# Patient Record
Sex: Female | Born: 1985 | Race: White | Hispanic: No | Marital: Single | Smoking: Current every day smoker
Health system: Southern US, Community
[De-identification: ages and names within clinical notes are randomized; demographics above are authoritative.]

## PROBLEM LIST (undated history)

## (undated) DIAGNOSIS — J939 Pneumothorax, unspecified: Secondary | ICD-10-CM

## (undated) HISTORY — PX: TUBAL LIGATION: SHX77

## (undated) HISTORY — PX: CHOLECYSTECTOMY: SHX55

---

## 2019-10-17 ENCOUNTER — Other Ambulatory Visit: Payer: Self-pay

## 2019-10-17 ENCOUNTER — Encounter (HOSPITAL_BASED_OUTPATIENT_CLINIC_OR_DEPARTMENT_OTHER): Payer: Self-pay | Admitting: Emergency Medicine

## 2019-10-17 ENCOUNTER — Emergency Department (HOSPITAL_BASED_OUTPATIENT_CLINIC_OR_DEPARTMENT_OTHER)
Admission: EM | Admit: 2019-10-17 | Discharge: 2019-10-17 | Disposition: A | Discharge status: Home / Self Care | Payer: Self-pay | Attending: Emergency Medicine | Admitting: Emergency Medicine

## 2019-10-17 DIAGNOSIS — N760 Acute vaginitis: Secondary | ICD-10-CM | POA: Insufficient documentation

## 2019-10-17 DIAGNOSIS — B9689 Other specified bacterial agents as the cause of diseases classified elsewhere: Secondary | ICD-10-CM | POA: Insufficient documentation

## 2019-10-17 DIAGNOSIS — F17210 Nicotine dependence, cigarettes, uncomplicated: Secondary | ICD-10-CM | POA: Insufficient documentation

## 2019-10-17 DIAGNOSIS — N939 Abnormal uterine and vaginal bleeding, unspecified: Secondary | ICD-10-CM | POA: Insufficient documentation

## 2019-10-17 HISTORY — DX: Pneumothorax, unspecified: J93.9

## 2019-10-17 LAB — URINALYSIS, MICROSCOPIC (REFLEX)

## 2019-10-17 LAB — URINALYSIS, ROUTINE W REFLEX MICROSCOPIC
Bilirubin Urine: NEGATIVE
Glucose, UA: NEGATIVE mg/dL
Ketones, ur: NEGATIVE mg/dL
Leukocytes,Ua: NEGATIVE
Nitrite: NEGATIVE
Protein, ur: NEGATIVE mg/dL
Specific Gravity, Urine: 1.01 (ref 1.005–1.030)
pH: 6.5 (ref 5.0–8.0)

## 2019-10-17 LAB — WET PREP, GENITAL
Sperm: NONE SEEN
Trich, Wet Prep: NONE SEEN
Yeast Wet Prep HPF POC: NONE SEEN

## 2019-10-17 MED ORDER — METRONIDAZOLE 500 MG PO TABS: 500.0000 mg | ORAL_TABLET | Freq: Two times a day (BID) | ORAL | 0 refills | Status: DC

## 2019-10-17 NOTE — Discharge Instructions (Addendum)
Follow-up with OB/GYN for further evaluation management of her vaginal bleeding. Take Flagyl as prescribed.  Take entire course, even if your symptoms improve. Do not drink alcohol while taking Flagyl as this can affect your liver. You have tests that are pending for cervical infections such as gonorrhea and chlamydia.  If positive, you will receive a phone call and will need treatment.  If negative, you will not receive a phone call.  Either way, you may check online on MyChart. Return to the emergency room if you develop high fevers, persistent nausea/vomiting, severe abdominal pain, or any new, worsening, or concerning symptoms.

## 2019-10-17 NOTE — ED Provider Notes (Signed)
[facility] EMERGENCY DEPARTMENT Provider Note   CSN: [id number] Arrival date & time: 10/17/19  1010     History   Chief Complaint Chief Complaint  Patient presents with  . Hematuria    HPI Abigail Mendoza is a 33 y.o. female presenting for evaluation of hematuria.   Patient states that the past week, she has noticed blood every time she urinates.  She is also noticing blood in her underwear in between urination.  She had this evaluated at [facility] last week, but no diagnosis was made.  Patient does not think this is coming from her vaginal canal, although has not had this evaluated, no pelvic was performed.  She denies fevers, chills, nausea, vomiting, abdominal pain, back pain.  She denies dysuria or urinary frequency.  She denies vaginal discharge.  She is sexually active with one female partner who is symptom-free.  They do not use condoms with protection.  She has had her tubes tied. Her LMP was 10/01.  Patient states initially she noted the blood was bright red, but now it is dark red.  There are no clots.  Patient states she has a history of fibroids, was on medication for this but has not been for the past 4 years due to loss of insurance.  She has not taken anything for her symptoms.  Patient states she takes amitriptyline at night for sleep, has no other medical problems and takes no other medications daily.     HPI  Past Medical History:  Diagnosis Date  . Pneumothorax     There are no active problems to display for this patient.   OB History   No obstetric history on file.      Home Medications    Prior to Admission medications   Medication Sig Start Date End Date Taking? Authorizing Provider  metroNIDAZOLE (FLAGYL) 500 MG tablet Take 1 tablet (500 mg total) by mouth 2 (two) times daily. 10/17/19   Chardae Mulkern, PA-C    Family History No family history on file.  Social History Social History   Tobacco Use  . Smoking status: Current  Every Day Smoker  . Smokeless tobacco: Never Used  Substance Use Topics  . Alcohol use: Not Currently  . Drug use: Never     Allergies   Penicillins   Review of Systems Review of Systems  Constitutional: Negative for fever.  Gastrointestinal: Negative for abdominal pain, nausea and vomiting.  Genitourinary:       Vaginal/urianry bleeding     Physical Exam Updated Vital Signs BP (!) 142/78 (BP Location: Right Arm)   Pulse 92   Temp 98.4 F (36.9 C) (Oral)   Resp 18   Ht 5\' 6"  (1.676 m)   Wt 63.5 kg   LMP 09/23/2019   SpO2 100%   BMI 22.60 kg/m   Physical Exam Vitals signs and nursing note reviewed. Exam conducted with a chaperone present.  Constitutional:      General: She is not in acute distress.    Appearance: She is well-developed.     Comments: Resting comfortably in the bed, no acute distress  HENT:     Head: Normocephalic and atraumatic.  Eyes:     Extraocular Movements: Extraocular movements intact.     Conjunctiva/sclera: Conjunctivae normal.     Pupils: Pupils are equal, round, and reactive to light.  Neck:     Musculoskeletal: Normal range of motion and neck supple.  Cardiovascular:     Rate and  Rhythm: Normal rate and regular rhythm.     Pulses: Normal pulses.  Pulmonary:     Effort: Pulmonary effort is normal. No respiratory distress.     Breath sounds: Normal breath sounds. No wheezing.  Abdominal:     General: There is no distension.     Palpations: Abdomen is soft. There is no mass.     Tenderness: There is no abdominal tenderness. There is no guarding or rebound.     Comments: No tenderness palpation the abdomen.  Soft without rigidity, guarding, distention.  Negative rebound.  No CVA tenderness.  Genitourinary:    Cervix: Cervical bleeding present.     Comments: Dark red blood noted in vaginal canal coming from the cervix.  No blood noted coming from the urethra.  No CMT or adnexal tenderness. No discharge noted Musculoskeletal: Normal  range of motion.  Skin:    General: Skin is warm and dry.  Neurological:     Mental Status: She is alert and oriented to person, place, and time.  Psychiatric:        Mood and Affect: Mood is anxious.     Comments: Pt appears anxious about her condition      ED Treatments / Results  Labs (all labs ordered are listed, but only abnormal results are displayed) Labs Reviewed  WET PREP, GENITAL - Abnormal; Notable for the following components:      Result Value   Clue Cells Wet Prep HPF POC PRESENT (*)    WBC, Wet Prep HPF POC MANY (*)    All other components within normal limits  URINALYSIS, ROUTINE W REFLEX MICROSCOPIC - Abnormal; Notable for the following components:   Color, Urine PINK (*)    APPearance HAZY (*)    Hgb urine dipstick LARGE (*)    All other components within normal limits  URINALYSIS, MICROSCOPIC (REFLEX) - Abnormal; Notable for the following components:   Bacteria, UA FEW (*)    All other components within normal limits  URINE CULTURE  GC/CHLAMYDIA PROBE AMP ([place]) NOT AT [facility]    EKG None  Radiology No results found.  Procedures Procedures (including critical care time)  Medications Ordered in ED Medications - No data to display   Initial Impression / Assessment and Plan / ED Course  I have reviewed the triage vital signs and the nursing notes.  Pertinent labs & imaging results that were available during my care of the patient were reviewed by me and considered in my medical decision making (see chart for details).        Patient went in for evaluation of hematuria.  Physical exam shows patient who appears nontoxic.  No abdominal tenderness, CVA tenderness, nausea, vomiting.  Doubt kidney stone or intra-abdominal infection.  As patient is having bleeding in between urination, consider vaginal cause of bleeding as opposed to urinary cause.  As such, will perform pelvic exam.  Will also obtain UA to assess for urinary infection.  Pelvic  exam shows blood in the vaginal canal, this is likely the source.  No CMT or adnexal tenderness.  I do not believe patient needs emergent ultrasound.  Consider symptoms due to fibroids versus early period.  Will obtain wet prep, send tests for gonorrhea and chlamydia.   Urine shows large blood with 21-50 red cells, could be hematuria versus blood from the vaginal canal.  Wet prep positive for clue cells, will treat for BV.  Discussed pending gonorrhea and Chlamydia results.  Discussed importance of follow-up  with OB/GYN for evaluation of vaginal bleeding.  At this time, patient appears safe for discharge.  Return precautions given.  Patient states she understands and agrees to plan.   Final Clinical Impressions(s) / ED Diagnoses   Final diagnoses:  BV (bacterial vaginosis)  Vaginal bleeding    ED Discharge Orders         Ordered    metroNIDAZOLE (FLAGYL) 500 MG tablet  2 times daily     10/17/19 1129           Milda Lindvall, PA-C 10/17/19 [place], [place], DO 10/18/19 [phone]

## 2019-10-17 NOTE — ED Triage Notes (Signed)
Hematuria x 1 week. Denies pain.

## 2019-10-18 LAB — URINE CULTURE: Culture: NO GROWTH

## 2019-10-20 LAB — GC/CHLAMYDIA PROBE AMP (~~LOC~~) NOT AT ARMC
Chlamydia: NEGATIVE
Neisseria Gonorrhea: NEGATIVE

## 2020-02-27 ENCOUNTER — Other Ambulatory Visit: Payer: Self-pay

## 2020-02-27 ENCOUNTER — Encounter (HOSPITAL_BASED_OUTPATIENT_CLINIC_OR_DEPARTMENT_OTHER): Payer: Self-pay | Admitting: Emergency Medicine

## 2020-02-27 ENCOUNTER — Emergency Department (HOSPITAL_BASED_OUTPATIENT_CLINIC_OR_DEPARTMENT_OTHER)
Admission: EM | Admit: 2020-02-27 | Discharge: 2020-02-27 | Disposition: A | Discharge status: Home / Self Care | Payer: Managed Care, Other (non HMO) | Attending: Emergency Medicine | Admitting: Emergency Medicine

## 2020-02-27 DIAGNOSIS — F17210 Nicotine dependence, cigarettes, uncomplicated: Secondary | ICD-10-CM | POA: Diagnosis not present

## 2020-02-27 DIAGNOSIS — A5901 Trichomonal vulvovaginitis: Secondary | ICD-10-CM | POA: Diagnosis not present

## 2020-02-27 DIAGNOSIS — R300 Dysuria: Secondary | ICD-10-CM | POA: Diagnosis present

## 2020-02-27 DIAGNOSIS — N760 Acute vaginitis: Secondary | ICD-10-CM | POA: Diagnosis not present

## 2020-02-27 DIAGNOSIS — Z79899 Other long term (current) drug therapy: Secondary | ICD-10-CM | POA: Insufficient documentation

## 2020-02-27 DIAGNOSIS — B9689 Other specified bacterial agents as the cause of diseases classified elsewhere: Secondary | ICD-10-CM

## 2020-02-27 LAB — URINALYSIS, ROUTINE W REFLEX MICROSCOPIC
Bilirubin Urine: NEGATIVE
Glucose, UA: NEGATIVE mg/dL
Hgb urine dipstick: NEGATIVE
Ketones, ur: NEGATIVE mg/dL
Leukocytes,Ua: NEGATIVE
Nitrite: NEGATIVE
Protein, ur: NEGATIVE mg/dL
Specific Gravity, Urine: >1.03 — ABNORMAL HIGH (ref 1.005–1.030)
pH: 6.5 (ref 5.0–8.0)

## 2020-02-27 LAB — WET PREP, GENITAL
Sperm: NONE SEEN
Yeast Wet Prep HPF POC: NONE SEEN

## 2020-02-27 LAB — PREGNANCY, URINE: Preg Test, Ur: NEGATIVE

## 2020-02-27 MED ORDER — METRONIDAZOLE 500 MG PO TABS: 500.0000 mg | ORAL_TABLET | Freq: Two times a day (BID) | ORAL | 0 refills | Status: AC

## 2020-02-27 NOTE — ED Triage Notes (Signed)
Pt reports burning and itching with urination x 2 days. Pt concerned of UTI or yeast infection. Pt denies abdominal pain, denies difficulty with urination

## 2020-02-27 NOTE — ED Provider Notes (Signed)
[facility] EMERGENCY DEPARTMENT Provider Note   CSN: [id number] Arrival date & time: 02/27/20  1316     History Chief Complaint  Patient presents with  . Dysuria    Abigail Mendoza is a 34 y.o. female.  The history is provided by the patient and medical records. No language interpreter was used.  Dysuria  Abigail Mendoza is a 34 y.o. female who presents to the Emergency Department complaining of dysuria. She presents the emergency department complaining of painful urination as well as vaginal itching and burning. She has a fishy odor and vaginal discharge. She experienced similar symptoms in the past in October and was diagnosed with BV. She denies any fevers, nausea, vomiting, diarrhea. She is not sexually active. She has no additional medical problems and takes no medications.    Past Medical History:  Diagnosis Date  . Pneumothorax     There are no problems to display for this patient.   Past Surgical History:  Procedure Laterality Date  . CHOLECYSTECTOMY    . TUBAL LIGATION       OB History   No obstetric history on file.     History reviewed. No pertinent family history.  Social History   Tobacco Use  . Smoking status: Current Every Day Smoker  . Smokeless tobacco: Never Used  Substance Use Topics  . Alcohol use: Not Currently  . Drug use: Never    Home Medications Prior to Admission medications   Medication Sig Start Date End Date Taking? Authorizing Provider  amitriptyline (ELAVIL) 25 MG tablet Take by mouth. 01/19/20 04/18/20 Yes [provider]  buPROPion (WELLBUTRIN XL) 150 MG 24 hr tablet Take by mouth. 01/19/20 04/18/20 Yes [provider]  cetirizine (ZYRTEC) 10 MG tablet Take by mouth. 01/19/20 04/18/20 Yes [provider]  cholestyramine (QUESTRAN) 4 g packet Take by mouth. 01/25/20 04/24/20 Yes [provider]  norelgestromin-ethinyl estradiol (ORTHO EVRA) 150-35 MCG/24HR transdermal patch Place onto the skin. 01/19/20  04/18/20 Yes [provider]  omeprazole (PRILOSEC) 20 MG capsule Take by mouth. 01/19/20 04/18/20 Yes [provider]  metroNIDAZOLE (FLAGYL) 500 MG tablet Take 1 tablet (500 mg total) by mouth 2 (two) times daily. 02/27/20   Tilden Fossa, MD    Allergies    Penicillins  Review of Systems   Review of Systems  Genitourinary: Positive for dysuria.  All other systems reviewed and are negative.  Symptoms are moderate, constant, worsening.  Physical Exam Updated Vital Signs BP 121/70 (BP Location: Right Arm)   Pulse 88   Temp 98.6 F (37 C) (Oral)   Resp 16   Ht 5\' 4"  (1.626 m)   Wt 68 kg   LMP 02/13/2020   SpO2 99%   BMI 25.75 kg/m   Physical Exam Vitals and nursing note reviewed.  Constitutional:      Appearance: She is well-developed.  HENT:     Head: Normocephalic and atraumatic.  Cardiovascular:     Rate and Rhythm: Normal rate and regular rhythm.  Pulmonary:     Effort: Pulmonary effort is normal. No respiratory distress.  Abdominal:     Palpations: Abdomen is soft.     Tenderness: There is no abdominal tenderness. There is no guarding or rebound.  Genitourinary:    Comments: Small amount of white vaginal discharge.  Musculoskeletal:        General: No tenderness.  Skin:    General: Skin is warm and dry.  Neurological:     Mental Status: She  is alert and oriented to person, place, and time.  Psychiatric:        Behavior: Behavior normal.      ED Results / Procedures / Treatments   Labs (all labs ordered are listed, but only abnormal results are displayed) Labs Reviewed  WET PREP, GENITAL - Abnormal; Notable for the following components:      Result Value   Trich, Wet Prep PRESENT (*)    Clue Cells Wet Prep HPF POC PRESENT (*)    WBC, Wet Prep HPF POC MANY (*)    All other components within normal limits  URINALYSIS, ROUTINE W REFLEX MICROSCOPIC - Abnormal; Notable for the following components:   Specific Gravity, Urine >1.030 (*)     All other components within normal limits  PREGNANCY, URINE  GC/CHLAMYDIA PROBE AMP ([place]) NOT AT [facility]    EKG None  Radiology No results found.  Procedures Procedures (including critical care time)  Medications Ordered in ED Medications - No data to display  ED Course  I have reviewed the triage vital signs and the nursing notes.  Pertinent labs & imaging results that were available during my care of the patient were reviewed by me and considered in my medical decision making (see chart for details).    MDM Rules/Calculators/A&P                     Patient here for evaluation of vaginal itching, burning and foul-smelling vaginal discharge. She is non-toxic appearing on evaluation. Pelvic exam with small amount of discharge. No evidence of PID or tubo-ovarian abscess.  Pt initially declined STI testing, but blood prep is significant for trichomonas. Counseled patient on this finding and recommendation for further testing. Patient will perform a self swab for GC chlamydia testing. Discussed recommendation for HIV and syphilis testing and patient declines at this time. Discussed home care and treatment for trichomonas as well as BV. Discussed outpatient follow-up and return precautions.  Final Clinical Impression(s) / ED Diagnoses Final diagnoses:  BV (bacterial vaginosis)  Trichomonal vaginitis    Rx / DC Orders ED Discharge Orders         Ordered    metroNIDAZOLE (FLAGYL) 500 MG tablet  2 times daily     02/27/20 1634           Quintella Reichert, MD 02/27/20 1734

## 2020-03-01 LAB — GC/CHLAMYDIA PROBE AMP (~~LOC~~) NOT AT ARMC
Chlamydia: NEGATIVE
Neisseria Gonorrhea: NEGATIVE

## 2020-03-10 ENCOUNTER — Ambulatory Visit: Payer: Managed Care, Other (non HMO) | Attending: Internal Medicine

## 2020-03-10 DIAGNOSIS — Z23 Encounter for immunization: Secondary | ICD-10-CM

## 2020-03-10 NOTE — Progress Notes (Signed)
   Covid-19 Vaccination Clinic  Name:  Abigail Mendoza    MRN: [id number] DOB: 05/18/1986  03/10/2020  [name] was observed post Covid-19 immunization for 15 minutes without incident. She was provided with Vaccine Information Sheet and instruction to access the V-Safe system.   [name] was instructed to call 911 with any severe reactions post vaccine: Marland Kitchen Difficulty breathing  . Swelling of face and throat  . A fast heartbeat  . A bad rash all over body  . Dizziness and weakness   Immunizations Administered    Name Date Dose VIS Date Route   Pfizer COVID-19 Vaccine 03/10/2020  3:52 PM 0.3 mL 11/27/2019 Intramuscular   Manufacturer: [employer], [employer]   Lot: [id number]   NDC: [address]-7

## 2020-04-04 ENCOUNTER — Ambulatory Visit: Payer: Managed Care, Other (non HMO) | Attending: Internal Medicine

## 2020-04-04 DIAGNOSIS — Z23 Encounter for immunization: Secondary | ICD-10-CM

## 2020-04-04 NOTE — Progress Notes (Signed)
   Covid-19 Vaccination Clinic  Name:  Abigail Mendoza    MRN: [id number] DOB: Jun 23, 1986  04/04/2020  Abigail Mendoza was observed post Covid-19 immunization for 30 minutes based on pre-vaccination screening without incident. She was provided with Vaccine Information Sheet and instruction to access the V-Safe system.   Abigail Mendoza was instructed to call 911 with any severe reactions post vaccine: Marland Kitchen Difficulty breathing  . Swelling of face and throat  . A fast heartbeat  . A bad rash all over body  . Dizziness and weakness   Immunizations Administered    Name Date Dose VIS Date Route   Pfizer COVID-19 Vaccine 04/04/2020  2:37 PM 0.3 mL 02/10/2019 Intramuscular   Manufacturer: [employer], [employer]   Lot: [id number]   NDC: [address]-6

## 2020-04-15 ENCOUNTER — Other Ambulatory Visit: Payer: Self-pay

## 2020-04-15 ENCOUNTER — Emergency Department (HOSPITAL_BASED_OUTPATIENT_CLINIC_OR_DEPARTMENT_OTHER)
Admission: EM | Admit: 2020-04-15 | Discharge: 2020-04-15 | Disposition: A | Discharge status: Home / Self Care | Payer: Managed Care, Other (non HMO) | Attending: Emergency Medicine | Admitting: Emergency Medicine

## 2020-04-15 ENCOUNTER — Emergency Department (HOSPITAL_BASED_OUTPATIENT_CLINIC_OR_DEPARTMENT_OTHER): Payer: Managed Care, Other (non HMO)

## 2020-04-15 ENCOUNTER — Encounter (HOSPITAL_BASED_OUTPATIENT_CLINIC_OR_DEPARTMENT_OTHER): Payer: Self-pay | Admitting: *Deleted

## 2020-04-15 DIAGNOSIS — Z79899 Other long term (current) drug therapy: Secondary | ICD-10-CM | POA: Diagnosis not present

## 2020-04-15 DIAGNOSIS — F17200 Nicotine dependence, unspecified, uncomplicated: Secondary | ICD-10-CM | POA: Insufficient documentation

## 2020-04-15 DIAGNOSIS — Z9049 Acquired absence of other specified parts of digestive tract: Secondary | ICD-10-CM | POA: Diagnosis not present

## 2020-04-15 DIAGNOSIS — M25561 Pain in right knee: Secondary | ICD-10-CM | POA: Diagnosis not present

## 2020-04-15 DIAGNOSIS — R2241 Localized swelling, mass and lump, right lower limb: Secondary | ICD-10-CM | POA: Insufficient documentation

## 2020-04-15 NOTE — ED Triage Notes (Signed)
Rt knee pain x 1.5 weeks  Denies inj  States swelling to inside of knee

## 2020-04-15 NOTE — ED Notes (Signed)
Pt. Is on a B/C patch per Pt.

## 2020-04-15 NOTE — ED Provider Notes (Signed)
[facility] EMERGENCY DEPARTMENT Provider Note   CSN: [id number] Arrival date & time: 04/15/20  1529     History Chief Complaint  Patient presents with  . Knee Pain    Abigail Mendoza is a 34 y.o. female.  34 year old female presents with complaint of right knee pain and swelling for the past week and a half.  Patient states pain radiates down her posterior leg to her knee, tenderness to the posterior knee as well as swelling noted to the medial knee.  Patient is taking ibuprofen without improvement in her swelling.  Denies any falls or injuries to this knee.  No other complaints or concerns today.        Past Medical History:  Diagnosis Date  . Pneumothorax     There are no problems to display for this patient.   Past Surgical History:  Procedure Laterality Date  . CHOLECYSTECTOMY    . TUBAL LIGATION       OB History   No obstetric history on file.     No family history on file.  Social History   Tobacco Use  . Smoking status: Current Every Day Smoker  . Smokeless tobacco: Never Used  Substance Use Topics  . Alcohol use: Not Currently  . Drug use: Never    Home Medications Prior to Admission medications   Medication Sig Start Date End Date Taking? Authorizing Provider  amitriptyline (ELAVIL) 25 MG tablet Take by mouth. 01/19/20 04/18/20  [provider]  buPROPion (WELLBUTRIN XL) 150 MG 24 hr tablet Take by mouth. 01/19/20 04/18/20  [provider]  cetirizine (ZYRTEC) 10 MG tablet Take by mouth. 01/19/20 04/18/20  [provider]  cholestyramine (QUESTRAN) 4 g packet Take by mouth. 01/25/20 04/24/20  [provider]  metroNIDAZOLE (FLAGYL) 500 MG tablet Take 1 tablet (500 mg total) by mouth 2 (two) times daily. 02/27/20   Tilden Fossa, MD  norelgestromin-ethinyl estradiol (ORTHO EVRA) 150-35 MCG/24HR transdermal patch Place onto the skin. 01/19/20 04/18/20  [provider]  omeprazole (PRILOSEC) 20 MG capsule Take by  mouth. 01/19/20 04/18/20  [provider]    Allergies    Penicillins  Review of Systems   Review of Systems  Constitutional: Negative for fever.  Musculoskeletal: Positive for arthralgias, joint swelling and myalgias. Negative for gait problem.  Skin: Negative for color change, rash and wound.  Allergic/Immunologic: Negative for immunocompromised state.  Neurological: Negative for weakness and numbness.    Physical Exam Updated Vital Signs BP (!) 147/94 (BP Location: Left Arm)   Pulse (!) 111   Temp 98.3 F (36.8 C) (Oral)   Ht 5\' 4"  (1.626 m)   Wt 71.2 kg   LMP 04/06/2020   SpO2 100%   BMI 26.94 kg/m   Physical Exam Vitals and nursing note reviewed.  Constitutional:      General: She is not in acute distress.    Appearance: She is well-developed. She is not diaphoretic.  HENT:     Head: Normocephalic and atraumatic.  Pulmonary:     Effort: Pulmonary effort is normal.  Musculoskeletal:     Lumbar back: No tenderness or bony tenderness. Normal range of motion.     Right knee: No swelling, effusion, ecchymosis or bony tenderness. Normal range of motion. Tenderness present. No medial joint line or lateral joint line tenderness.     Left knee: Normal.       Legs:  Skin:    General: Skin is warm and dry.  Neurological:  Mental Status: She is alert and oriented to person, place, and time.  Psychiatric:        Behavior: Behavior normal.     ED Results / Procedures / Treatments   Labs (all labs ordered are listed, but only abnormal results are displayed) Labs Reviewed - No data to display  EKG None  Radiology US Venous Img Lower Unilateral Right  Result Date: 04/15/2020 CLINICAL DATA:  Right lower extremity swelling 2 weeks. EXAM: RIGHT LOWER EXTREMITY VENOUS DOPPLER ULTRASOUND TECHNIQUE: Gray-scale sonography with compression, as well as color and duplex ultrasound, were performed to evaluate the deep venous system(s) from the level of the common  femoral vein through the popliteal and proximal calf veins. COMPARISON:  None. FINDINGS: VENOUS Normal compressibility of the common femoral, superficial femoral, and popliteal veins, as well as the visualized calf veins. Visualized portions of profunda femoral vein and great saphenous vein unremarkable. No filling defects to suggest DVT on grayscale or color Doppler imaging. Doppler waveforms show normal direction of venous flow, normal respiratory plasticity and response to augmentation. Limited views of the contralateral common femoral vein are unremarkable. OTHER None. Limitations: none IMPRESSION: Negative. Electronically Signed   By: Marin Olp M.D.   On: 04/15/2020 17:15   DG Knee Complete 4 Views Right  Result Date: 04/15/2020 CLINICAL DATA:  Posterior right knee pain for 10 days. EXAM: RIGHT KNEE - COMPLETE 4+ VIEW COMPARISON:  None. FINDINGS: No evidence of fracture, dislocation, or joint effusion. No evidence of arthropathy or other focal bone abnormality. Soft tissues are unremarkable. IMPRESSION: Negative. Electronically Signed   By: Van Clines M.D.   On: 04/15/2020 17:09    Procedures Procedures (including critical care time)  Medications Ordered in ED Medications - No data to display  ED Course  I have reviewed the triage vital signs and the nursing notes.  Pertinent labs & imaging results that were available during my care of the patient were reviewed by me and considered in my medical decision making (see chart for details).  Clinical Course as of Apr 16 1735  Fri Apr 15, 6122  2725 34 year old female with complaint of pain in her right knee with swelling medially.  On exam has tenderness to the posterior right knee, no erythema, no palpable cords.  Patient was noted to be tachycardic in triage, with complaint of posterior knee pain without injury tachycardia, concern for DVT.  Venous ultrasound of the right lower leg is negative for DVT, x-ray is unremarkable.   Patient is referred to sports medicine for follow-up, consider tendinitis versus Baker's cyst.   [LM]    Clinical Course User Index [LM] Roque Lias   MDM Rules/Calculators/A&P                      Final Clinical Impression(s) / ED Diagnoses Final diagnoses:  Acute pain of right knee    Rx / DC Orders ED Discharge Orders    None       Tacy Learn, PA-C 04/15/20 1736    Gareth Morgan, MD 04/16/20 1441

## 2020-04-15 NOTE — Discharge Instructions (Addendum)
You can continue with Motrin/Tylenol for pain if this helps. Follow up with orthopedics, referral given. The ultrasound and x-ray of your knee are normal, you do not have a blood clot in your leg or bony abnormality.

## 2020-04-15 NOTE — ED Notes (Signed)
Pt. Reports that the R leg has been hurting for a week and a half just behind the R knee.  Pt. 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  Pt. Has no numbness or tingling in R leg or R foot.

## 2020-07-11 ENCOUNTER — Emergency Department (HOSPITAL_BASED_OUTPATIENT_CLINIC_OR_DEPARTMENT_OTHER)
Admission: EM | Admit: 2020-07-11 | Discharge: 2020-07-11 | Disposition: A | Discharge status: Home / Self Care | Payer: Managed Care, Other (non HMO) | Attending: Emergency Medicine | Admitting: Emergency Medicine

## 2020-07-11 ENCOUNTER — Other Ambulatory Visit: Payer: Self-pay

## 2020-07-11 ENCOUNTER — Encounter (HOSPITAL_BASED_OUTPATIENT_CLINIC_OR_DEPARTMENT_OTHER): Payer: Self-pay | Admitting: *Deleted

## 2020-07-11 DIAGNOSIS — R079 Chest pain, unspecified: Secondary | ICD-10-CM | POA: Diagnosis not present

## 2020-07-11 DIAGNOSIS — Z5321 Procedure and treatment not carried out due to patient leaving prior to being seen by health care provider: Secondary | ICD-10-CM | POA: Diagnosis not present

## 2020-07-11 NOTE — ED Notes (Signed)
Called to take to room  No response from lobby  

## 2020-07-11 NOTE — ED Triage Notes (Signed)
Sharp and dull pain in her chest x 2 days. Denies cough.  Hx of same in the past caused by stress.

## 2020-08-31 ENCOUNTER — Other Ambulatory Visit: Payer: Self-pay

## 2020-08-31 ENCOUNTER — Emergency Department (HOSPITAL_COMMUNITY)
Admission: EM | Admit: 2020-08-31 | Discharge: 2020-08-31 | Disposition: A | Discharge status: Home / Self Care | Payer: PRIVATE HEALTH INSURANCE | Attending: Emergency Medicine | Admitting: Emergency Medicine

## 2020-08-31 DIAGNOSIS — Z5321 Procedure and treatment not carried out due to patient leaving prior to being seen by health care provider: Secondary | ICD-10-CM | POA: Insufficient documentation

## 2020-08-31 DIAGNOSIS — M549 Dorsalgia, unspecified: Secondary | ICD-10-CM | POA: Diagnosis not present

## 2020-08-31 DIAGNOSIS — Y990 Civilian activity done for income or pay: Secondary | ICD-10-CM | POA: Diagnosis not present

## 2020-08-31 DIAGNOSIS — X500XXA Overexertion from strenuous movement or load, initial encounter: Secondary | ICD-10-CM | POA: Insufficient documentation

## 2020-08-31 NOTE — ED Notes (Signed)
Pt called no response

## 2020-08-31 NOTE — ED Triage Notes (Signed)
Pt arrived via walk in, c/o back pain since Sunday, injured herself at work while lifting heavy boxes. Seen at urgent care sun, dx with muscle strain, given muscle relaxer. Pain still present.

## 2021-08-23 IMAGING — CR DG KNEE COMPLETE 4+V*R*
4 series · 4 of 4 positions shown · non-contrast
Comparison: None.

CLINICAL DATA: Posterior right knee pain for 10 days.

EXAM:
RIGHT KNEE - COMPLETE 4+ VIEW

[t knee ap right]
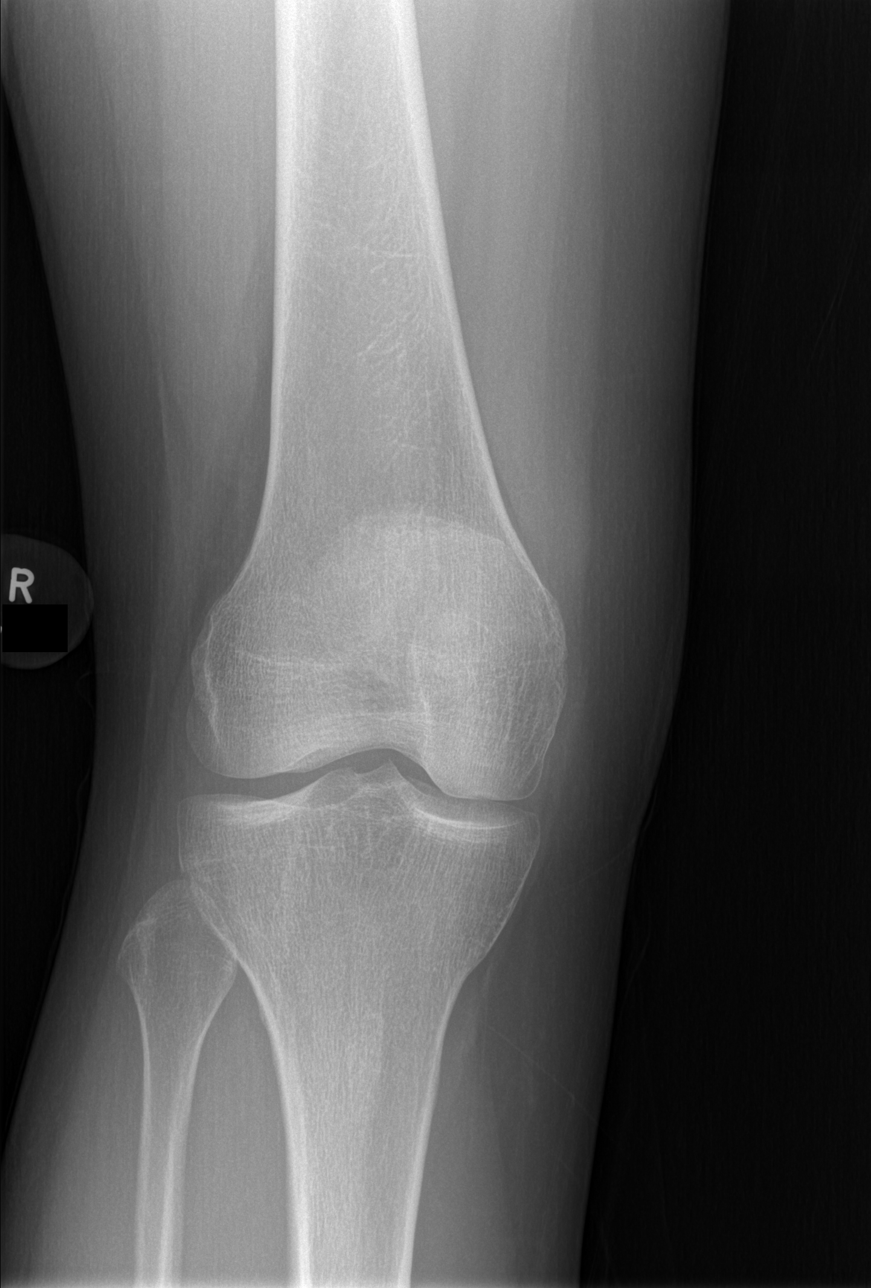

[t knee oblique right (1 of 2)]
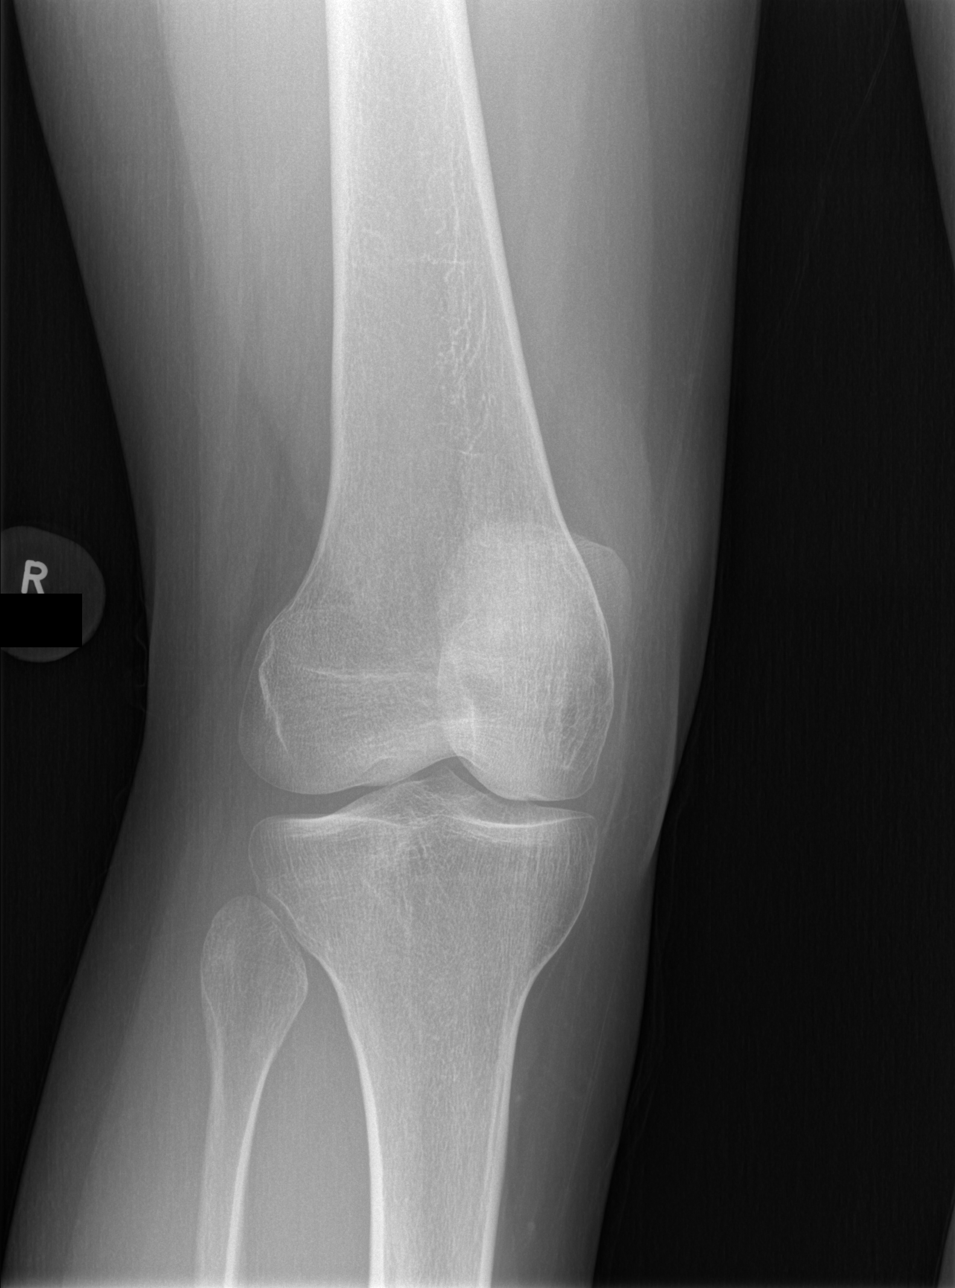

[t knee oblique right (2 of 2)]
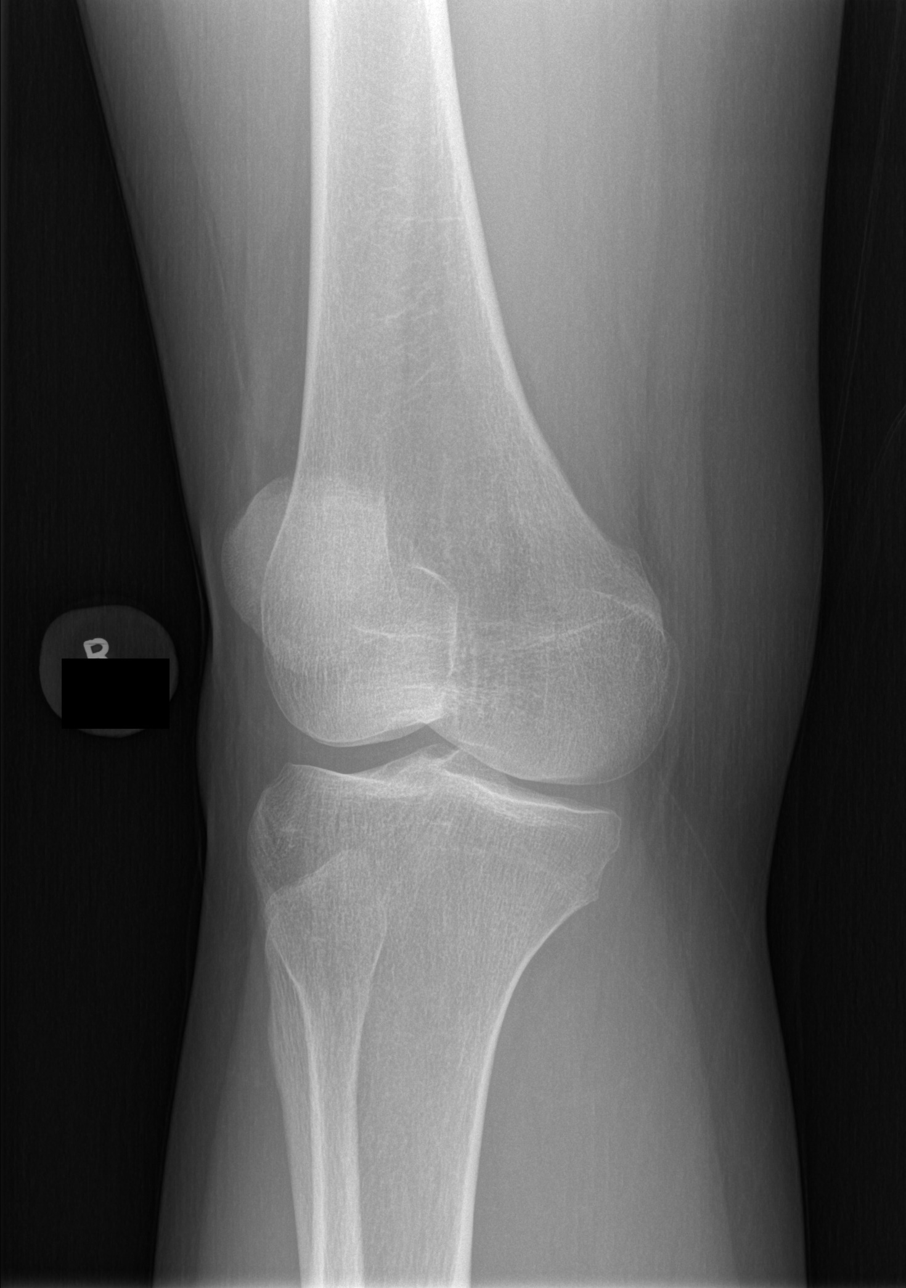

[t knee lat right]
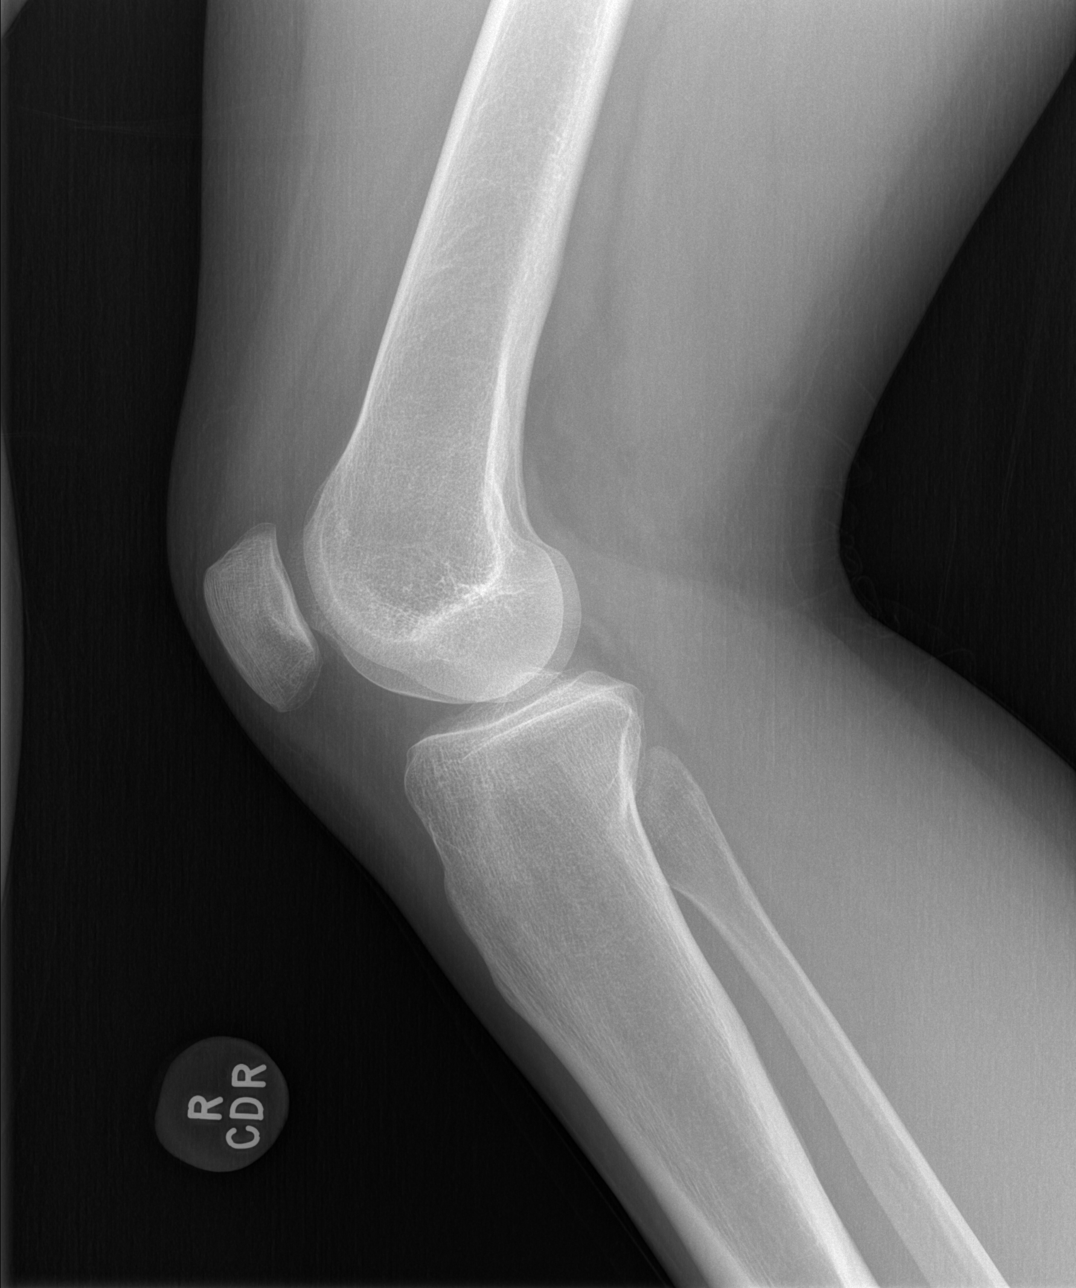

[4 of 4 positions shown; findings below may reference images not displayed]

FINDINGS: No evidence of fracture, dislocation, or joint effusion. No evidence
of arthropathy or other focal bone abnormality. Soft tissues are
unremarkable.
IMPRESSION: Negative.
